# Patient Record
Sex: Female | Born: 1950 | Race: White | Hispanic: No | Marital: Married | Smoking: Never smoker
Health system: Southern US, Community
[De-identification: ages and names within clinical notes are randomized; demographics above are authoritative.]

## PROBLEM LIST (undated history)

## (undated) DIAGNOSIS — M1990 Unspecified osteoarthritis, unspecified site: Secondary | ICD-10-CM

## (undated) DIAGNOSIS — C801 Malignant (primary) neoplasm, unspecified: Secondary | ICD-10-CM

## (undated) DIAGNOSIS — T7840XA Allergy, unspecified, initial encounter: Secondary | ICD-10-CM

## (undated) DIAGNOSIS — I10 Essential (primary) hypertension: Secondary | ICD-10-CM

## (undated) DIAGNOSIS — E785 Hyperlipidemia, unspecified: Secondary | ICD-10-CM

## (undated) DIAGNOSIS — Z85828 Personal history of other malignant neoplasm of skin: Secondary | ICD-10-CM

---

## 1998-06-24 HISTORY — PX: FRACTURE SURGERY: SHX138

## 1999-11-15 ENCOUNTER — Other Ambulatory Visit: Admission: RE | Admit: 1999-11-15 | Discharge: 1999-11-15 | Payer: Self-pay | Admitting: Family Medicine

## 2000-12-04 ENCOUNTER — Other Ambulatory Visit: Admission: RE | Admit: 2000-12-04 | Discharge: 2000-12-04 | Payer: Self-pay | Admitting: Family Medicine

## 2001-03-30 ENCOUNTER — Encounter: Payer: Self-pay | Admitting: Family Medicine

## 2001-03-30 ENCOUNTER — Encounter: Admission: RE | Admit: 2001-03-30 | Discharge: 2001-03-30 | Payer: Self-pay | Admitting: Family Medicine

## 2001-12-30 ENCOUNTER — Other Ambulatory Visit: Admission: RE | Admit: 2001-12-30 | Discharge: 2001-12-30 | Payer: Self-pay | Admitting: Family Medicine

## 2002-06-24 HISTORY — PX: JOINT REPLACEMENT: SHX530

## 2015-09-28 NOTE — H&P (Signed)
TOTAL HIP ADMISSION H&P  Patient is admitted for left total hip arthroplasty, anterior approach.  Subjective:  Chief Complaint:    Left hip primary OA / pain  HPI: Michele Chavez, 65 y.o. female, has a history of pain and functional disability in the left hip(s) due to arthritis and patient has failed non-surgical conservative treatments for greater than 12 weeks to include NSAID's and/or analgesics, use of assistive devices and activity modification.  Onset of symptoms was gradual starting ~1 years ago with gradually worsening course since that time.The patient noted prior procedures of the hip to include arthroplasty on the right hip in [place], [place].  Patient currently rates pain in the left hip at 9 out of 10 with activity. Patient has night pain, worsening of pain with activity and weight bearing, trendelenberg gait, pain that interfers with activities of daily living and pain with passive range of motion. Patient has evidence of periarticular osteophytes and joint space narrowing by imaging studies. This condition presents safety issues increasing the risk of falls.   There is no current active infection.   Risks, benefits and expectations were discussed with the patient.  Risks including but not limited to the risk of anesthesia, blood clots, nerve damage, blood vessel damage, failure of the prosthesis, infection and up to and including death.  Patient understand the risks, benefits and expectations and wishes to proceed with surgery.   PCP: Lajuana Matte  D/C Plans:      Home  Post-op Meds:       No Rx given  Tranexamic Acid:      To be given - IV  Decadron:      Is to be given  FYI:     ASA  Norco     Past Medical History  Diagnosis Date  . Hypertension   . Hyperlipidemia   . Arthritis   . History of skin cancer   . Cancer [facility])     HX SKIN CANCER    Past Surgical History  Procedure Laterality Date  . Joint replacement  2004    RT TOTAL HIP  . Fracture surgery  2000    L  ANKLE     No prescriptions prior to admission   Not on File   Social History  Substance Use Topics  . Smoking status: Never Smoker   . Smokeless tobacco: Not on file  . Alcohol Use: Yes     Comment: RARE       Review of Systems  Constitutional: Negative.   HENT: Negative.   Eyes: Negative.   Respiratory: Negative.   Cardiovascular: Negative.   Gastrointestinal: Negative.   Genitourinary: Negative.   Musculoskeletal: Positive for joint pain.  Skin: Negative.   Neurological: Negative.   Endo/Heme/Allergies: Negative.   Psychiatric/Behavioral: Negative.     Objective:  Physical Exam  Constitutional: She is oriented to person, place, and time. She appears well-developed.  HENT:  Head: Normocephalic.  Eyes: Pupils are equal, round, and reactive to light.  Neck: Neck supple. No JVD present. No tracheal deviation present. No thyromegaly present.  Cardiovascular: Normal rate, regular rhythm, normal heart sounds and intact distal pulses.   Respiratory: Effort normal and breath sounds normal. No stridor. No respiratory distress. She has no wheezes.  GI: Soft. There is no tenderness. There is no guarding.  Musculoskeletal:       Left hip: She exhibits decreased range of motion, decreased strength, tenderness and bony tenderness. She exhibits no swelling, no deformity and no laceration.  Lymphadenopathy:    She has no cervical adenopathy.  Neurological: She is alert and oriented to person, place, and time.  Skin: Skin is warm and dry.  Psychiatric: She has a normal mood and affect.      Imaging Review Plain radiographs demonstrate severe degenerative joint disease of the left hip(s). The bone quality appears to be good for age and reported activity level.  Assessment/Plan:  End stage arthritis, left hip(s)  The patient history, physical examination, clinical judgement of the provider and imaging studies are consistent with end stage degenerative joint disease of the  left hip(s) and total hip arthroplasty is deemed medically necessary. The treatment options including medical management, injection therapy, arthroscopy and arthroplasty were discussed at length. The risks and benefits of total hip arthroplasty were presented and reviewed. The risks due to aseptic loosening, infection, stiffness, dislocation/subluxation,  thromboembolic complications and other imponderables were discussed.  The patient acknowledged the explanation, agreed to proceed with the plan and consent was signed. Patient is being admitted for inpatient treatment for surgery, pain control, PT, OT, prophylactic antibiotics, VTE prophylaxis, progressive ambulation and ADL's and discharge planning.The patient is planning to be discharged home.      [place] Pugh Aislinn Feliz   PA-C  10/19/2015, 8:57 AM

## 2015-10-16 NOTE — Patient Instructions (Addendum)
YOUR PROCEDURE IS SCHEDULED ON :5/2 /17  REPORT TO [facility] MAIN ENTRANCE FOLLOW SIGNS TO EAST ELEVATOR - GO TO 3rd FLOOR CHECK IN AT 3 EAST NURSES STATION (SHORT STAY) AT:  8:30 AM  CALL THIS NUMBER IF YOU HAVE PROBLEMS THE MORNING OF SURGERY [phone]  REMEMBER:ONLY 1 PER PERSON MAY GO TO SHORT STAY WITH YOU TO GET READY THE MORNING OF YOUR SURGERY  DO NOT EAT FOOD OR DRINK LIQUIDS AFTER MIDNIGHT  TAKE THESE MEDICINES THE MORNING OF SURGERY: NONE  YOU MAY NOT HAVE ANY METAL ON YOUR BODY INCLUDING HAIR PINS AND PIERCING'S. DO NOT WEAR JEWELRY, MAKEUP, LOTIONS, POWDERS OR PERFUMES. DO NOT WEAR NAIL POLISH. DO NOT SHAVE 48 HRS PRIOR TO SURGERY. MEN MAY SHAVE FACE AND NECK.  DO NOT [place]. [facility] IS NOT RESPONSIBLE FOR VALUABLES.  CONTACTS, DENTURES OR PARTIALS MAY NOT BE WORN TO SURGERY. LEAVE SUITCASE IN CAR. CAN BE BROUGHT TO ROOM AFTER SURGERY.  PATIENTS DISCHARGED THE DAY OF SURGERY WILL NOT BE ALLOWED TO DRIVE HOME.  PLEASE READ OVER THE FOLLOWING INSTRUCTION SHEETS _________________________________________________________________________________                                          [facility] - PREPARING FOR SURGERY  Before surgery, you can play an important role.  Because skin is not sterile, your skin needs to be as free of germs as possible.  You can reduce the number of germs on your skin by washing with CHG (chlorahexidine gluconate) soap before surgery.  CHG is an antiseptic cleaner which kills germs and bonds with the skin to continue killing germs even after washing. Please DO NOT use if you have an allergy to CHG or antibacterial soaps.  If your skin becomes reddened/irritated stop using the CHG and inform your nurse when you arrive at Short Stay. Do not shave (including legs and underarms) for at least 48 hours prior to the first CHG shower.  You may shave your face. Please follow these instructions  carefully:   1.  Shower with CHG Soap the night before surgery and the  morning of Surgery.   2.  If you choose to wash your hair, wash your hair first as usual with your  normal  Shampoo.   3.  After you shampoo, rinse your hair and body thoroughly to remove the  shampoo.                                         4.  Use CHG as you would any other liquid soap.  You can apply chg directly  to the skin and wash . Gently wash with scrungie or clean wascloth    5.  Apply the CHG Soap to your body ONLY FROM THE NECK DOWN.   Do not use on open                           Wound or open sores. Avoid contact with eyes, ears mouth and genitals (private parts).                        Genitals (private parts) with your normal soap.  6.  Wash thoroughly, paying special attention to the area where your surgery  will be performed.   7.  Thoroughly rinse your body with warm water from the neck down.   8.  DO NOT shower/wash with your normal soap after using and rinsing off  the CHG Soap .                9.  Pat yourself dry with a clean towel.             10.  Wear clean night clothes to bed after shower             11.  Place clean sheets on your bed the night of your first shower and do not  sleep with pets.  Day of Surgery : Do not apply any lotions/deodorants the morning of surgery.  Please wear clean clothes to the hospital/surgery center.  FAILURE TO FOLLOW THESE INSTRUCTIONS MAY RESULT IN THE CANCELLATION OF YOUR SURGERY    PATIENT SIGNATURE_________________________________  ______________________________________________________________________     Adam Phenix  An incentive spirometer is a tool that can help keep your lungs clear and active. This tool measures how well you are filling your lungs with each breath. Taking long deep breaths may help reverse or decrease the chance of developing breathing (pulmonary) problems (especially infection) following:  A long  period of time when you are unable to move or be active. BEFORE THE PROCEDURE   If the spirometer includes an indicator to show your best effort, your nurse or respiratory therapist will set it to a desired goal.  If possible, sit up straight or lean slightly forward. Try not to slouch.  Hold the incentive spirometer in an upright position. INSTRUCTIONS FOR USE   Sit on the edge of your bed if possible, or sit up as far as you can in bed or on a chair.  Hold the incentive spirometer in an upright position.  Breathe out normally.  Place the mouthpiece in your mouth and seal your lips tightly around it.  Breathe in slowly and as deeply as possible, raising the piston or the ball toward the top of the column.  Hold your breath for 3-5 seconds or for as long as possible. Allow the piston or ball to fall to the bottom of the column.  Remove the mouthpiece from your mouth and breathe out normally.  Rest for a few seconds and repeat Steps 1 through 7 at least 10 times every 1-2 hours when you are awake. Take your time and take a few normal breaths between deep breaths.  The spirometer may include an indicator to show your best effort. Use the indicator as a goal to work toward during each repetition.  After each set of 10 deep breaths, practice coughing to be sure your lungs are clear. If you have an incision (the cut made at the time of surgery), support your incision when coughing by placing a pillow or rolled up towels firmly against it. Once you are able to get out of bed, walk around indoors and cough well. You may stop using the incentive spirometer when instructed by your caregiver.  RISKS AND COMPLICATIONS  Take your time so you do not get dizzy or light-headed.  If you are in pain, you may need to take or ask for pain medication before doing incentive spirometry. It is harder to take a deep breath if you are having pain. AFTER USE  Rest and breathe slowly and easily.  It can  be helpful to keep track of a log of your progress. Your caregiver can provide you with a simple table to help with this. If you are using the spirometer at home, follow these instructions: [place] IF:   You are having difficultly using the spirometer.  You have trouble using the spirometer as often as instructed.  Your pain medication is not giving enough relief while using the spirometer.  You develop fever of 100.5 F (38.1 C) or higher. SEEK IMMEDIATE MEDICAL CARE IF:   You cough up bloody sputum that had not been present before.  You develop fever of 102 F (38.9 C) or greater.  You develop worsening pain at or near the incision site. MAKE SURE YOU:   Understand these instructions.  Will watch your condition.  Will get help right away if you are not doing well or get worse. Document Released: 10/21/2006 Document Revised: 09/02/2011 Document Reviewed: 12/22/2006 Exit[employer] Patient Information 2014 [employer], [place].   ________________________________________________________________________  WHAT IS A BLOOD TRANSFUSION? Blood Transfusion Information  A transfusion is the replacement of blood or some of its parts. Blood is made up of multiple cells which provide different functions.  Red blood cells carry oxygen and are used for blood loss replacement.  White blood cells fight against infection.  Platelets control bleeding.  Plasma helps clot blood.  Other blood products are available for specialized needs, such as hemophilia or other clotting disorders. BEFORE THE TRANSFUSION  Who gives blood for transfusions?   Healthy volunteers who are fully evaluated to make sure their blood is safe. This is blood bank blood. Transfusion therapy is the safest it has ever been in the practice of medicine. Before blood is taken from a donor, a complete history is taken to make sure that person has no history of diseases nor engages in risky social behavior (examples are  intravenous drug use or sexual activity with multiple partners). The donor's travel history is screened to minimize risk of transmitting infections, such as malaria. The donated blood is tested for signs of infectious diseases, such as HIV and hepatitis. The blood is then tested to be sure it is compatible with you in order to minimize the chance of a transfusion reaction. If you or a relative donates blood, this is often done in anticipation of surgery and is not appropriate for emergency situations. It takes many days to process the donated blood. RISKS AND COMPLICATIONS Although transfusion therapy is very safe and saves many lives, the main dangers of transfusion include:   Getting an infectious disease.  Developing a transfusion reaction. This is an allergic reaction to something in the blood you were given. Every precaution is taken to prevent this. The decision to have a blood transfusion has been considered carefully by your caregiver before blood is given. Blood is not given unless the benefits outweigh the risks. AFTER THE TRANSFUSION  Right after receiving a blood transfusion, you will usually feel much better and more energetic. This is especially true if your red blood cells have gotten low (anemic). The transfusion raises the level of the red blood cells which carry oxygen, and this usually causes an energy increase.  The nurse administering the transfusion will monitor you carefully for complications. HOME CARE INSTRUCTIONS  No special instructions are needed after a transfusion. You may find your energy is better. Speak with your caregiver about any limitations on activity for underlying diseases you may have. SEEK MEDICAL CARE IF:   Your  condition is not improving after your transfusion.  You develop redness or irritation at the intravenous (IV) site. SEEK IMMEDIATE MEDICAL CARE IF:  Any of the following symptoms occur over the next 12 hours:  Shaking chills.  You have a  temperature by mouth above 102 F (38.9 C), not controlled by medicine.  Chest, back, or muscle pain.  People around you feel you are not acting correctly or are confused.  Shortness of breath or difficulty breathing.  Dizziness and fainting.  You get a rash or develop hives.  You have a decrease in urine output.  Your urine turns a dark color or changes to pink, red, or brown. Any of the following symptoms occur over the next 10 days:  You have a temperature by mouth above 102 F (38.9 C), not controlled by medicine.  Shortness of breath.  Weakness after normal activity.  The white part of the eye turns yellow (jaundice).  You have a decrease in the amount of urine or are urinating less often.  Your urine turns a dark color or changes to pink, red, or brown. Document Released: 06/07/2000 Document Revised: 09/02/2011 Document Reviewed: 01/25/2008 [facility] Patient Information 2014 [place], [place].  _______________________________________________________________________

## 2015-10-18 ENCOUNTER — Encounter (HOSPITAL_COMMUNITY)
Admission: RE | Admit: 2015-10-18 | Discharge: 2015-10-18 | Disposition: A | Discharge status: Home / Self Care | Payer: Medicare Other | Source: Ambulatory Visit | Attending: Orthopedic Surgery | Admitting: Orthopedic Surgery

## 2015-10-18 ENCOUNTER — Other Ambulatory Visit (HOSPITAL_COMMUNITY): Payer: Self-pay

## 2015-10-18 ENCOUNTER — Other Ambulatory Visit: Payer: Self-pay

## 2015-10-18 ENCOUNTER — Encounter (HOSPITAL_COMMUNITY): Payer: Self-pay

## 2015-10-18 DIAGNOSIS — I10 Essential (primary) hypertension: Secondary | ICD-10-CM | POA: Diagnosis not present

## 2015-10-18 DIAGNOSIS — Z0183 Encounter for blood typing: Secondary | ICD-10-CM | POA: Diagnosis not present

## 2015-10-18 DIAGNOSIS — E785 Hyperlipidemia, unspecified: Secondary | ICD-10-CM | POA: Insufficient documentation

## 2015-10-18 DIAGNOSIS — Z01818 Encounter for other preprocedural examination: Secondary | ICD-10-CM | POA: Insufficient documentation

## 2015-10-18 DIAGNOSIS — M1612 Unilateral primary osteoarthritis, left hip: Secondary | ICD-10-CM | POA: Insufficient documentation

## 2015-10-18 DIAGNOSIS — Z01812 Encounter for preprocedural laboratory examination: Secondary | ICD-10-CM | POA: Insufficient documentation

## 2015-10-18 HISTORY — DX: Personal history of other malignant neoplasm of skin: Z85.828

## 2015-10-18 HISTORY — DX: Essential (primary) hypertension: I10

## 2015-10-18 HISTORY — DX: Malignant (primary) neoplasm, unspecified: C80.1

## 2015-10-18 HISTORY — DX: Hyperlipidemia, unspecified: E78.5

## 2015-10-18 HISTORY — DX: Unspecified osteoarthritis, unspecified site: M19.90

## 2015-10-18 LAB — URINALYSIS, ROUTINE W REFLEX MICROSCOPIC
Bilirubin Urine: NEGATIVE
Glucose, UA: NEGATIVE mg/dL
HGB URINE DIPSTICK: NEGATIVE
Ketones, ur: NEGATIVE mg/dL
Nitrite: NEGATIVE
PH: 6 (ref 5.0–8.0)
Protein, ur: NEGATIVE mg/dL
SPECIFIC GRAVITY, URINE: 1.015 (ref 1.005–1.030)

## 2015-10-18 LAB — CBC
HCT: 42.3 % (ref 36.0–46.0)
Hemoglobin: 14 g/dL (ref 12.0–15.0)
MCH: 30 pg (ref 26.0–34.0)
MCHC: 33.1 g/dL (ref 30.0–36.0)
MCV: 90.6 fL (ref 78.0–100.0)
PLATELETS: 213 10*3/uL (ref 150–400)
RBC: 4.67 MIL/uL (ref 3.87–5.11)
RDW: 14.3 % (ref 11.5–15.5)
WBC: 5.2 10*3/uL (ref 4.0–10.5)

## 2015-10-18 LAB — PROTIME-INR
INR: 1.07 (ref 0.00–1.49)
PROTHROMBIN TIME: 14.1 s (ref 11.6–15.2)

## 2015-10-18 LAB — BASIC METABOLIC PANEL
Anion gap: 10 (ref 5–15)
BUN: 19 mg/dL (ref 6–20)
CALCIUM: 9.4 mg/dL (ref 8.9–10.3)
CO2: 26 mmol/L (ref 22–32)
CREATININE: 0.99 mg/dL (ref 0.44–1.00)
Chloride: 102 mmol/L (ref 101–111)
GFR, EST NON AFRICAN AMERICAN: 58 mL/min — AB (ref >60)
Glucose, Bld: 144 mg/dL — ABNORMAL HIGH (ref 65–99)
Potassium: 3.7 mmol/L (ref 3.5–5.1)
SODIUM: 138 mmol/L (ref 135–145)

## 2015-10-18 LAB — SURGICAL PCR SCREEN
MRSA, PCR: NEGATIVE
STAPHYLOCOCCUS AUREUS: NEGATIVE

## 2015-10-18 LAB — URINE MICROSCOPIC-ADD ON

## 2015-10-18 LAB — APTT: APTT: 27 s (ref 24–37)

## 2015-10-18 LAB — ABO/RH: ABO/RH(D): O POS

## 2015-10-19 NOTE — Progress Notes (Signed)
Abnormal UA faxed to Dr.Olin 

## 2015-10-24 ENCOUNTER — Inpatient Hospital Stay (HOSPITAL_COMMUNITY): Payer: Medicare Other | Admitting: Anesthesiology

## 2015-10-24 ENCOUNTER — Inpatient Hospital Stay (HOSPITAL_COMMUNITY): Payer: Medicare Other

## 2015-10-24 ENCOUNTER — Encounter (HOSPITAL_COMMUNITY)
Admission: RE | Disposition: A | Discharge status: Home Health | Payer: Self-pay | Source: Ambulatory Visit | Attending: Orthopedic Surgery

## 2015-10-24 ENCOUNTER — Inpatient Hospital Stay (HOSPITAL_COMMUNITY)
Admission: RE | Admit: 2015-10-24 | Discharge: 2015-10-25 | DRG: 470 | Disposition: A | Discharge status: Home Health | Payer: Medicare Other | Source: Ambulatory Visit | Attending: Orthopedic Surgery | Admitting: Orthopedic Surgery

## 2015-10-24 ENCOUNTER — Encounter (HOSPITAL_COMMUNITY): Payer: Self-pay

## 2015-10-24 DIAGNOSIS — M1612 Unilateral primary osteoarthritis, left hip: Secondary | ICD-10-CM | POA: Diagnosis present

## 2015-10-24 DIAGNOSIS — Z96641 Presence of right artificial hip joint: Secondary | ICD-10-CM | POA: Diagnosis present

## 2015-10-24 DIAGNOSIS — Z6838 Body mass index (BMI) 38.0-38.9, adult: Secondary | ICD-10-CM

## 2015-10-24 DIAGNOSIS — I10 Essential (primary) hypertension: Secondary | ICD-10-CM | POA: Diagnosis present

## 2015-10-24 DIAGNOSIS — E669 Obesity, unspecified: Secondary | ICD-10-CM | POA: Diagnosis present

## 2015-10-24 DIAGNOSIS — Z96649 Presence of unspecified artificial hip joint: Secondary | ICD-10-CM

## 2015-10-24 DIAGNOSIS — E785 Hyperlipidemia, unspecified: Secondary | ICD-10-CM | POA: Diagnosis present

## 2015-10-24 DIAGNOSIS — M25552 Pain in left hip: Secondary | ICD-10-CM | POA: Diagnosis present

## 2015-10-24 HISTORY — DX: Allergy, unspecified, initial encounter: T78.40XA

## 2015-10-24 HISTORY — PX: TOTAL HIP ARTHROPLASTY: SHX124

## 2015-10-24 LAB — TYPE AND SCREEN
ABO/RH(D): O POS
Antibody Screen: NEGATIVE

## 2015-10-24 SURGERY — ARTHROPLASTY, HIP, TOTAL, ANTERIOR APPROACH
Anesthesia: Spinal | Site: Hip | Laterality: Left

## 2015-10-24 MED ORDER — FENTANYL CITRATE (PF) 100 MCG/2ML IJ SOLN
INTRAMUSCULAR | Status: DC | PRN
  Administered 2015-10-24 (×2): 50 ug via INTRAVENOUS

## 2015-10-24 MED ORDER — PROPOFOL 500 MG/50ML IV EMUL
INTRAVENOUS | Status: DC | PRN
  Administered 2015-10-24: 25 ug/kg/min via INTRAVENOUS

## 2015-10-24 MED ORDER — MENTHOL 3 MG MT LOZG: 1.0000 | LOZENGE | OROMUCOSAL | Status: DC | PRN

## 2015-10-24 MED ORDER — PROPOFOL 10 MG/ML IV BOLUS
INTRAVENOUS | Status: AC
  Filled 2015-10-24: qty 40

## 2015-10-24 MED ORDER — CELECOXIB 200 MG PO CAPS
200.0000 mg | ORAL_CAPSULE | Freq: Two times a day (BID) | ORAL | Status: DC
  Administered 2015-10-24 – 2015-10-25 (×2): 200 mg via ORAL
  Filled 2015-10-24 (×3): qty 1

## 2015-10-24 MED ORDER — PHENOL 1.4 % MT LIQD
1.0000 | OROMUCOSAL | Status: DC | PRN
Start: 2015-10-24 — End: 2015-10-26

## 2015-10-24 MED ORDER — SODIUM CHLORIDE 0.9 % IV SOLN
100.0000 mL/h | INTRAVENOUS | Status: DC
  Administered 2015-10-24 – 2015-10-25 (×2): 100 mL/h via INTRAVENOUS
  Filled 2015-10-24 (×4): qty 1000

## 2015-10-24 MED ORDER — PROPOFOL 10 MG/ML IV BOLUS
INTRAVENOUS | Status: AC
  Filled 2015-10-24: qty 20

## 2015-10-24 MED ORDER — HYDROMORPHONE HCL 1 MG/ML IJ SOLN
INTRAMUSCULAR | Status: AC
  Filled 2015-10-24: qty 1

## 2015-10-24 MED ORDER — ASPIRIN EC 325 MG PO TBEC
325.0000 mg | DELAYED_RELEASE_TABLET | Freq: Two times a day (BID) | ORAL | Status: DC
  Administered 2015-10-25 (×2): 325 mg via ORAL
  Filled 2015-10-24 (×3): qty 1

## 2015-10-24 MED ORDER — PROPOFOL 10 MG/ML IV BOLUS
INTRAVENOUS | Status: DC | PRN
  Administered 2015-10-24: 20 mg via INTRAVENOUS

## 2015-10-24 MED ORDER — DEXAMETHASONE SODIUM PHOSPHATE 10 MG/ML IJ SOLN
10.0000 mg | Freq: Once | INTRAMUSCULAR | Status: AC
  Administered 2015-10-24: 10 mg via INTRAVENOUS

## 2015-10-24 MED ORDER — MEPERIDINE HCL 50 MG/ML IJ SOLN: 6.2500 mg | INTRAMUSCULAR | Status: DC | PRN

## 2015-10-24 MED ORDER — DIPHENHYDRAMINE HCL 25 MG PO CAPS: 25.0000 mg | ORAL_CAPSULE | Freq: Four times a day (QID) | ORAL | Status: DC | PRN

## 2015-10-24 MED ORDER — CEFAZOLIN SODIUM-DEXTROSE 2-4 GM/100ML-% IV SOLN
2.0000 g | INTRAVENOUS | Status: AC
  Administered 2015-10-24: 2 g via INTRAVENOUS

## 2015-10-24 MED ORDER — EPHEDRINE SULFATE 50 MG/ML IJ SOLN
INTRAMUSCULAR | Status: DC | PRN
  Administered 2015-10-24 (×2): 10 mg via INTRAVENOUS

## 2015-10-24 MED ORDER — ONDANSETRON HCL 4 MG PO TABS: 4.0000 mg | ORAL_TABLET | Freq: Four times a day (QID) | ORAL | Status: DC | PRN

## 2015-10-24 MED ORDER — LACTATED RINGERS IV SOLN
INTRAVENOUS | Status: DC
  Administered 2015-10-24 (×2): via INTRAVENOUS

## 2015-10-24 MED ORDER — TRANEXAMIC ACID 1000 MG/10ML IV SOLN
1000.0000 mg | Freq: Once | INTRAVENOUS | Status: AC
  Administered 2015-10-24: 1000 mg via INTRAVENOUS
  Filled 2015-10-24: qty 10

## 2015-10-24 MED ORDER — METHOCARBAMOL 1000 MG/10ML IJ SOLN
500.0000 mg | Freq: Four times a day (QID) | INTRAVENOUS | Status: DC | PRN
  Administered 2015-10-24: 500 mg via INTRAVENOUS
  Filled 2015-10-24: qty 5
  Filled 2015-10-24: qty 550

## 2015-10-24 MED ORDER — FENTANYL CITRATE (PF) 100 MCG/2ML IJ SOLN
INTRAMUSCULAR | Status: AC
  Filled 2015-10-24: qty 2

## 2015-10-24 MED ORDER — CHLORHEXIDINE GLUCONATE 4 % EX LIQD: 60.0000 mL | Freq: Once | CUTANEOUS | Status: DC

## 2015-10-24 MED ORDER — OLMESARTAN MEDOXOMIL-HCTZ 40-25 MG PO TABS: 1.0000 | ORAL_TABLET | Freq: Every day | ORAL | Status: DC

## 2015-10-24 MED ORDER — MIDAZOLAM HCL 5 MG/5ML IJ SOLN
INTRAMUSCULAR | Status: DC | PRN
  Administered 2015-10-24 (×2): 1 mg via INTRAVENOUS

## 2015-10-24 MED ORDER — PRAVASTATIN SODIUM 20 MG PO TABS
20.0000 mg | ORAL_TABLET | Freq: Every day | ORAL | Status: DC
  Administered 2015-10-24: 20 mg via ORAL
  Filled 2015-10-24 (×2): qty 1

## 2015-10-24 MED ORDER — ONDANSETRON HCL 4 MG/2ML IJ SOLN: 4.0000 mg | Freq: Four times a day (QID) | INTRAMUSCULAR | Status: DC | PRN

## 2015-10-24 MED ORDER — HYDROMORPHONE HCL 1 MG/ML IJ SOLN
0.2500 mg | INTRAMUSCULAR | Status: DC | PRN
  Administered 2015-10-24 (×4): 0.5 mg via INTRAVENOUS

## 2015-10-24 MED ORDER — BUPIVACAINE IN DEXTROSE 0.75-8.25 % IT SOLN
INTRATHECAL | Status: DC | PRN
  Administered 2015-10-24: 2 mL via INTRATHECAL

## 2015-10-24 MED ORDER — IRBESARTAN 300 MG PO TABS
300.0000 mg | ORAL_TABLET | Freq: Every day | ORAL | Status: DC
  Filled 2015-10-24: qty 1

## 2015-10-24 MED ORDER — SODIUM CHLORIDE 0.9 % IR SOLN
Status: DC | PRN
  Administered 2015-10-24: 1000 mL

## 2015-10-24 MED ORDER — DOCUSATE SODIUM 100 MG PO CAPS
100.0000 mg | ORAL_CAPSULE | Freq: Two times a day (BID) | ORAL | Status: DC
Start: 2015-10-24 — End: 2015-10-26
  Administered 2015-10-24 – 2015-10-25 (×2): 100 mg via ORAL

## 2015-10-24 MED ORDER — HYDROMORPHONE HCL 1 MG/ML IJ SOLN: 0.5000 mg | INTRAMUSCULAR | Status: DC | PRN

## 2015-10-24 MED ORDER — LACTATED RINGERS IV SOLN
INTRAVENOUS | Status: DC
  Administered 2015-10-24: 14:00:00 via INTRAVENOUS

## 2015-10-24 MED ORDER — CEFAZOLIN SODIUM-DEXTROSE 2-4 GM/100ML-% IV SOLN
INTRAVENOUS | Status: AC
  Filled 2015-10-24: qty 100

## 2015-10-24 MED ORDER — BISACODYL 10 MG RE SUPP: 10.0000 mg | Freq: Every day | RECTAL | Status: DC | PRN

## 2015-10-24 MED ORDER — POLYETHYLENE GLYCOL 3350 17 G PO PACK: 17.0000 g | PACK | Freq: Two times a day (BID) | ORAL | Status: DC

## 2015-10-24 MED ORDER — SODIUM CHLORIDE 0.9 % IV SOLN
10.0000 mg | INTRAVENOUS | Status: DC | PRN
  Administered 2015-10-24: 50 ug/min via INTRAVENOUS

## 2015-10-24 MED ORDER — METHOCARBAMOL 500 MG PO TABS
500.0000 mg | ORAL_TABLET | Freq: Four times a day (QID) | ORAL | Status: DC | PRN
  Administered 2015-10-24 – 2015-10-25 (×3): 500 mg via ORAL
  Filled 2015-10-24 (×3): qty 1

## 2015-10-24 MED ORDER — MIDAZOLAM HCL 2 MG/2ML IJ SOLN
INTRAMUSCULAR | Status: AC
  Filled 2015-10-24: qty 2

## 2015-10-24 MED ORDER — ALUM & MAG HYDROXIDE-SIMETH 200-200-20 MG/5ML PO SUSP: 30.0000 mL | ORAL | Status: DC | PRN

## 2015-10-24 MED ORDER — HYDROCODONE-ACETAMINOPHEN 7.5-325 MG PO TABS
1.0000 | ORAL_TABLET | ORAL | Status: DC
  Administered 2015-10-24 – 2015-10-25 (×6): 2 via ORAL
  Filled 2015-10-24 (×8): qty 2

## 2015-10-24 MED ORDER — FERROUS SULFATE 325 (65 FE) MG PO TABS
325.0000 mg | ORAL_TABLET | Freq: Three times a day (TID) | ORAL | Status: DC
  Administered 2015-10-25: 325 mg via ORAL
  Filled 2015-10-24 (×5): qty 1

## 2015-10-24 MED ORDER — PHENYLEPHRINE HCL 10 MG/ML IJ SOLN
INTRAMUSCULAR | Status: AC
  Filled 2015-10-24: qty 1

## 2015-10-24 MED ORDER — ONDANSETRON HCL 4 MG/2ML IJ SOLN
INTRAMUSCULAR | Status: AC
  Filled 2015-10-24: qty 2

## 2015-10-24 MED ORDER — METOCLOPRAMIDE HCL 10 MG PO TABS: 5.0000 mg | ORAL_TABLET | Freq: Three times a day (TID) | ORAL | Status: DC | PRN

## 2015-10-24 MED ORDER — HYDROCHLOROTHIAZIDE 25 MG PO TABS
25.0000 mg | ORAL_TABLET | Freq: Every day | ORAL | Status: DC
  Filled 2015-10-24: qty 1

## 2015-10-24 MED ORDER — CEFAZOLIN SODIUM-DEXTROSE 2-4 GM/100ML-% IV SOLN
2.0000 g | Freq: Four times a day (QID) | INTRAVENOUS | Status: AC
  Administered 2015-10-24 – 2015-10-25 (×2): 2 g via INTRAVENOUS
  Filled 2015-10-24 (×2): qty 100

## 2015-10-24 MED ORDER — METOCLOPRAMIDE HCL 5 MG/ML IJ SOLN
5.0000 mg | Freq: Three times a day (TID) | INTRAMUSCULAR | Status: DC | PRN
Start: 2015-10-24 — End: 2015-10-26

## 2015-10-24 MED ORDER — DEXAMETHASONE SODIUM PHOSPHATE 10 MG/ML IJ SOLN
INTRAMUSCULAR | Status: AC
  Filled 2015-10-24: qty 1

## 2015-10-24 MED ORDER — DEXAMETHASONE SODIUM PHOSPHATE 10 MG/ML IJ SOLN
10.0000 mg | Freq: Once | INTRAMUSCULAR | Status: AC
  Administered 2015-10-25: 10 mg via INTRAVENOUS
  Filled 2015-10-24: qty 1

## 2015-10-24 MED ORDER — MAGNESIUM CITRATE PO SOLN: 1.0000 | Freq: Once | ORAL | Status: DC | PRN

## 2015-10-24 MED ORDER — STERILE WATER FOR IRRIGATION IR SOLN
Status: DC | PRN
  Administered 2015-10-24: 2000 mL

## 2015-10-24 SURGICAL SUPPLY — 34 items
CAPT HIP TOTAL 2 ×1 IMPLANT
CLOTH BEACON ORANGE TIMEOUT ST (SAFETY) ×2 IMPLANT
COVER PERINEAL POST (MISCELLANEOUS) ×2 IMPLANT
DRAPE STERI IOBAN 125X83 (DRAPES) ×2 IMPLANT
DRAPE U-SHAPE 47X51 STRL (DRAPES) ×4 IMPLANT
DRESSING AQUACEL AG SP 3.5X10 (GAUZE/BANDAGES/DRESSINGS) ×1 IMPLANT
DRSG AQUACEL AG ADV 3.5X10 (GAUZE/BANDAGES/DRESSINGS) ×1 IMPLANT
DRSG AQUACEL AG SP 3.5X10 (GAUZE/BANDAGES/DRESSINGS) ×2
DURAPREP 26ML APPLICATOR (WOUND CARE) ×2 IMPLANT
ELECT PENCIL ROCKER SW 15FT (MISCELLANEOUS) ×1 IMPLANT
ELECT REM PT RETURN 9FT ADLT (ELECTROSURGICAL) ×2
ELECTRODE REM PT RTRN 9FT ADLT (ELECTROSURGICAL) ×1 IMPLANT
GLOVE BIOGEL M STRL SZ7.5 (GLOVE) ×2 IMPLANT
GLOVE BIOGEL PI IND STRL 7.0 (GLOVE) IMPLANT
GLOVE BIOGEL PI IND STRL 7.5 (GLOVE) ×1 IMPLANT
GLOVE BIOGEL PI INDICATOR 7.0 (GLOVE) ×1
GLOVE BIOGEL PI INDICATOR 7.5 (GLOVE) ×5
GLOVE ORTHO TXT STRL SZ7.5 (GLOVE) ×3 IMPLANT
GLOVE SURG SS PI 7.0 STRL IVOR (GLOVE) ×1 IMPLANT
GLOVE SURG SS PI 7.5 STRL IVOR (GLOVE) ×1 IMPLANT
GOWN SPEC L3 XXLG W/TWL (GOWN DISPOSABLE) ×1 IMPLANT
GOWN STRL REUS W/TWL LRG LVL3 (GOWN DISPOSABLE) ×2 IMPLANT
GOWN STRL REUS W/TWL XL LVL3 (GOWN DISPOSABLE) ×3 IMPLANT
HOLDER FOLEY CATH W/STRAP (MISCELLANEOUS) ×2 IMPLANT
LIQUID BAND (GAUZE/BANDAGES/DRESSINGS) ×2 IMPLANT
PACK ANTERIOR HIP CUSTOM (KITS) ×2 IMPLANT
SAW OSC TIP CART 19.5X105X1.3 (SAW) ×2 IMPLANT
SUT MNCRL AB 4-0 PS2 18 (SUTURE) ×2 IMPLANT
SUT VIC AB 1 CT1 36 (SUTURE) ×6 IMPLANT
SUT VIC AB 2-0 CT1 27 (SUTURE) ×6
SUT VIC AB 2-0 CT1 TAPERPNT 27 (SUTURE) ×2 IMPLANT
SUT VLOC 180 0 24IN GS25 (SUTURE) ×2 IMPLANT
TRAY FOLEY W/METER SILVER 14FR (SET/KITS/TRAYS/PACK) ×1 IMPLANT
YANKAUER SUCT BULB TIP 10FT TU (MISCELLANEOUS) ×1 IMPLANT

## 2015-10-24 NOTE — Anesthesia Postprocedure Evaluation (Signed)
Anesthesia Post Note  Patient: Michele Chavez  Procedure(s) Performed: Procedure(s) (LRB): LEFT TOTAL HIP ARTHROPLASTY ANTERIOR APPROACH (Left)  Patient location during evaluation: PACU Anesthesia Type: Spinal Level of consciousness: oriented and awake and alert Pain management: pain level controlled Vital Signs Assessment: post-procedure vital signs reviewed and stable Respiratory status: spontaneous breathing, respiratory function stable and patient connected to nasal cannula oxygen Cardiovascular status: blood pressure returned to baseline and stable Postop Assessment: no headache, no backache and spinal receding Anesthetic complications: no    Last Vitals:  Filed Vitals:   10/24/15 1439 10/24/15 1445  BP: 114/62 107/83  Pulse: 59 62  Temp:  36.4 C  Resp: 17 18    Last Pain:  Filed Vitals:   10/24/15 1447  PainSc: [place] [name]

## 2015-10-24 NOTE — Progress Notes (Signed)
Utilization review completed.  

## 2015-10-24 NOTE — Transfer of Care (Signed)
Immediate Anesthesia Transfer of Care Note  Patient: Michele Chavez  Procedure(s) Performed: Procedure(s): LEFT TOTAL HIP ARTHROPLASTY ANTERIOR APPROACH (Left)  Patient Location: PACU  Anesthesia Type:Spinal/MAC  Level of Consciousness:  sedated, patient cooperative and responds to stimulation  Airway & Oxygen Therapy:Patient Spontanous Breathing and Patient connected to face mask oxgen  Post-op Assessment:  Report given to PACU RN and Post -op Vital signs reviewed and stable  Post vital signs:  Reviewed and stable  Last Vitals:  Filed Vitals:   10/24/15 0836  BP: 144/67  Pulse: 74  Temp: 36.4 C  Resp: 16    Complications: No apparent anesthesia complications

## 2015-10-24 NOTE — Anesthesia Preprocedure Evaluation (Addendum)
Anesthesia Evaluation  Patient identified by MRN, date of birth, ID band Patient awake    Reviewed: Allergy & Precautions, NPO status , Patient's Chart, lab work & pertinent test results  Airway Mallampati: III   Neck ROM: Full  Mouth opening: Limited Mouth Opening  Dental  (+) Teeth Intact   Pulmonary neg pulmonary ROS,    breath sounds clear to auscultation       Cardiovascular hypertension, Pt. on medications  Rhythm:Regular Rate:Normal     Neuro/Psych negative neurological ROS  negative psych ROS   GI/Hepatic negative GI ROS, Neg liver ROS,   Endo/Other  negative endocrine ROS  Renal/GU negative Renal ROS  negative genitourinary   Musculoskeletal  (+) Arthritis ,   Abdominal (+) + obese,   Peds negative pediatric ROS (+)  Hematology negative hematology ROS (+)   Anesthesia Other Findings - HLD  Reproductive/Obstetrics negative OB ROS                            Lab Results  Component Value Date   WBC 5.2 10/18/2015   HGB 14.0 10/18/2015   HCT 42.3 10/18/2015   MCV 90.6 10/18/2015   PLT 213 10/18/2015   Lab Results  Component Value Date   INR 1.07 10/18/2015     Anesthesia Physical Anesthesia Plan  ASA: III  Anesthesia Plan: Spinal   Post-op Pain Management:    Induction: Intravenous  Airway Management Planned: Simple Face Mask  Additional Equipment:   Intra-op Plan:   Post-operative Plan: Extubation in OR  Informed Consent: I have reviewed the patients History and Physical, chart, labs and discussed the procedure including the risks, benefits and alternatives for the proposed anesthesia with the patient or authorized representative who has indicated his/her understanding and acceptance.     Plan Discussed with: CRNA  Anesthesia Plan Comments:         Anesthesia Quick Evaluation

## 2015-10-24 NOTE — Anesthesia Procedure Notes (Signed)
Spinal Patient location during procedure: OR Start time: 10/24/2015 11:11 AM End time: 10/24/2015 11:16 AM Staffing Resident/CRNA: Alfonso Patten J Performed by: resident/CRNA  Preanesthetic Checklist Completed: patient identified, site marked, surgical consent, pre-op evaluation, timeout performed, IV checked, risks and benefits discussed and monitors and equipment checked Spinal Block Patient position: sitting Prep: Betadine and site prepped and draped Patient monitoring: heart rate, continuous pulse ox and blood pressure Approach: midline Location: L3-4 Injection technique: single-shot Needle Needle type: Sprotte  Needle gauge: 24 G Needle length: 10 cm Additional Notes Expiration date on kit confirmed.    Single attempt - good CSF return, no blood or parethesia noted.  Pt. Tolerated procedure well.

## 2015-10-24 NOTE — Interval H&P Note (Signed)
History and Physical Interval Note:  10/24/2015 10:11 AM  Michele Chavez  has presented today for surgery, with the diagnosis of LEFT HIP OA  The various methods of treatment have been discussed with the patient and family. After consideration of risks, benefits and other options for treatment, the patient has consented to  Procedure(s): LEFT TOTAL HIP ARTHROPLASTY ANTERIOR APPROACH (Left) as a surgical intervention .  The patient's history has been reviewed, patient examined, no change in status, stable for surgery.  I have reviewed the patient's chart and labs.  Questions were answered to the patient's satisfaction.     Mauri Pole

## 2015-10-24 NOTE — Progress Notes (Signed)
Portable AP Pelvis  And Lateral Left Hip X-rays done 

## 2015-10-24 NOTE — Op Note (Signed)
NAME:  Michele Chavez NO.: [id number]      MEDICAL RECORD NO.: [id number]      FACILITY:  [facility]      PHYSICIAN:  Paralee Cancel D  DATE OF BIRTH:  Apr 03, 1951     DATE OF PROCEDURE:  10/24/2015                                 OPERATIVE REPORT         PREOPERATIVE DIAGNOSIS: Left  hip osteoarthritis.      POSTOPERATIVE DIAGNOSIS:  Left hip osteoarthritis. History of right total hip replacement     PROCEDURE:  Left total hip replacement through an anterior approach   utilizing DePuy THR system, component size 67mm pinnacle cup, a size 32+4 neutral   Altrex liner, a size 2 Hi Tri Lock stem with a 32+5 delta ceramic   ball.      SURGEON:  Pietro Cassis. Alvan Dame, M.D.      ASSISTANT:  Nehemiah Massed, PA-C     ANESTHESIA:  Spinal.      SPECIMENS:  None.      COMPLICATIONS:  None.      BLOOD LOSS:  400 cc     DRAINS:  None      INDICATION OF THE PROCEDURE:  Michele Chavez is a 65 y.o. female who had   presented to office for evaluation of left hip pain.  Radiographs revealed   progressive degenerative changes with bone-on-bone   articulation to the  hip joint.  The patient had painful limited range of   motion significantly affecting their overall quality of life.  The patient was failing to    respond to conservative measures, and at this point was ready   to proceed with more definitive measures.  The patient has noted progressive   degenerative changes in his hip, progressive problems and dysfunction   with regarding the hip prior to surgery.  Consent was obtained for   benefit of pain relief.  Specific risk of infection, DVT, component   failure, dislocation, need for revision surgery, as well discussion of   the anterior versus posterior approach were reviewed.  Consent was   obtained for benefit of anterior pain relief through an anterior   approach.      PROCEDURE IN DETAIL:  The patient was brought to operative theater.   Once  adequate anesthesia, preoperative antibiotics, 2gm of Ancef, 1 gm of Tranexamic Acid, and 10 mg of Decadron administered.   The patient was positioned supine on the OSI Hanna table.  Once adequate   padding of boney process was carried out, we had predraped out the hip, and  used fluoroscopy to confirm orientation of the pelvis and position.      The left hip was then prepped and draped from proximal iliac crest to   mid thigh with shower curtain technique.      Time-out was performed identifying the patient, planned procedure, and   extremity.     An incision was then made 2 cm distal and lateral to the   anterior superior iliac spine extending over the orientation of the   tensor fascia lata muscle and sharp dissection was carried down to the   fascia of the muscle and protractor placed in the soft tissues.  The fascia was then incised.  The muscle belly was identified and swept   laterally and retractor placed along the superior neck.  Following   cauterization of the circumflex vessels and removing some pericapsular   fat, a second cobra retractor was placed on the inferior neck.  A third   retractor was placed on the anterior acetabulum after elevating the   anterior rectus.  A L-capsulotomy was along the line of the   superior neck to the trochanteric fossa, then extended proximally and   distally.  Tag sutures were placed and the retractors were then placed   intracapsular.  We then identified the trochanteric fossa and   orientation of my neck cut, confirmed this radiographically   and then made a neck osteotomy with the femur on traction.  The femoral   head was removed without difficulty or complication.  Traction was let   off and retractors were placed posterior and anterior around the   acetabulum.      The labrum and foveal tissue were debrided.  I began reaming with a 55mm   reamer and reamed up to 74mm reamer with good bony bed preparation and a 7mm   cup was  chosen.  The final 6mm Pinnacle cup was then impacted under fluoroscopy  to confirm the depth of penetration and orientation with respect to   abduction.  A screw was placed followed by the hole eliminator.  The final   32+4 neutral Altrex liner was impacted with good visualized rim fit.  The cup was positioned anatomically within the acetabular portion of the pelvis.      At this point, the femur was rolled at 80 degrees.  Further capsule was   released off the inferior aspect of the femoral neck.  I then   released the superior capsule proximally.  The hook was placed laterally   along the femur and elevated manually and held in position with the bed   hook.  The leg was then extended and adducted with the leg rolled to 100   degrees of external rotation.  Once the proximal femur was fully   exposed, I used a box osteotome to set orientation.  I then began   broaching with the starting chili pepper broach and passed this by hand and then broached up to 2.  With the 2 broach in place I chose a high offset neck and did several trial reductions.  The offset was appropriate, leg lengths   appeared to be equal best matched with a +5 head ball confirmed radiographically.   Given these findings, I went ahead and dislocated the hip, repositioned all   retractors and positioned the right hip in the extended and abducted position.  The final 2Hi Tri Lock stem was   chosen and it was impacted down to the level of neck cut.  Based on this   and the trial reduction, a 32+5 delta ceramic ball was chosen and   impacted onto a clean and dry trunnion, and the hip was reduced.  The   hip had been irrigated throughout the case again at this point.  I did   reapproximate the superior capsular leaflet to the anterior leaflet   using #1 Vicryl.  The fascia of the   tensor fascia lata muscle was then reapproximated using #1 Vicryl and #0 V-lock sutures.  The   remaining wound was closed with 2-0 Vicryl and  running 4-0 Monocryl.   The hip was cleaned,  dried, and dressed sterilely using Dermabond and   Aquacel dressing.  She was then brought   to recovery room in stable condition tolerating the procedure well.    Nehemiah Massed, PA-C was present for the entirety of the case involved from   preoperative positioning, perioperative retractor management, general   facilitation of the case, as well as primary wound closure as assistant.            Pietro Cassis Alvan Dame, M.D.        10/24/2015 12:37 PM

## 2015-10-24 NOTE — Progress Notes (Signed)
X-ray results noted 

## 2015-10-25 DIAGNOSIS — E669 Obesity, unspecified: Secondary | ICD-10-CM | POA: Diagnosis present

## 2015-10-25 LAB — CBC
HCT: 33.1 % — ABNORMAL LOW (ref 36.0–46.0)
Hemoglobin: 11.1 g/dL — ABNORMAL LOW (ref 12.0–15.0)
MCH: 30.2 pg (ref 26.0–34.0)
MCHC: 33.5 g/dL (ref 30.0–36.0)
MCV: 89.9 fL (ref 78.0–100.0)
PLATELETS: 195 10*3/uL (ref 150–400)
RBC: 3.68 MIL/uL — AB (ref 3.87–5.11)
RDW: 14.1 % (ref 11.5–15.5)
WBC: 7.7 10*3/uL (ref 4.0–10.5)

## 2015-10-25 LAB — BASIC METABOLIC PANEL
ANION GAP: 8 (ref 5–15)
BUN: 12 mg/dL (ref 6–20)
CALCIUM: 8.3 mg/dL — AB (ref 8.9–10.3)
CO2: 24 mmol/L (ref 22–32)
Chloride: 106 mmol/L (ref 101–111)
Creatinine, Ser: 0.8 mg/dL (ref 0.44–1.00)
GFR calc Af Amer: >60 mL/min (ref >60)
Glucose, Bld: 151 mg/dL — ABNORMAL HIGH (ref 65–99)
POTASSIUM: 4 mmol/L (ref 3.5–5.1)
SODIUM: 138 mmol/L (ref 135–145)

## 2015-10-25 MED ORDER — HYDROCODONE-ACETAMINOPHEN 7.5-325 MG PO TABS: 1.0000 | ORAL_TABLET | ORAL | Status: DC | PRN

## 2015-10-25 MED ORDER — FERROUS SULFATE 325 (65 FE) MG PO TABS: 325.0000 mg | ORAL_TABLET | Freq: Three times a day (TID) | ORAL | Status: DC

## 2015-10-25 MED ORDER — POLYETHYLENE GLYCOL 3350 17 G PO PACK: 17.0000 g | PACK | Freq: Two times a day (BID) | ORAL | Status: DC

## 2015-10-25 MED ORDER — DOCUSATE SODIUM 100 MG PO CAPS: 100.0000 mg | ORAL_CAPSULE | Freq: Two times a day (BID) | ORAL | Status: DC

## 2015-10-25 MED ORDER — ASPIRIN 325 MG PO TBEC: 325.0000 mg | DELAYED_RELEASE_TABLET | Freq: Two times a day (BID) | ORAL | Status: AC

## 2015-10-25 MED ORDER — TIZANIDINE HCL 4 MG PO TABS: 4.0000 mg | ORAL_TABLET | Freq: Four times a day (QID) | ORAL | Status: DC | PRN

## 2015-10-25 NOTE — Progress Notes (Signed)
OT Note:  Pt plans to use commode just prior to leaving.  A 3:1 commode was delivered to room.  Pt also has a shower stall, so reviewed use of 3:1 as shower seat if desired (and recommendation that husband wipes legs off after use.  No further OT is needed at this time.  [place], OTR/L [id number] 10/25/2015

## 2015-10-25 NOTE — Progress Notes (Signed)
[facility]    [facility] is providing the following services: RW and Commode  If patient discharges after hours, please call [phone].   [name] 10/25/2015, 11:26 AM

## 2015-10-25 NOTE — Care Management Note (Signed)
Case Management Note  Patient Details  Name: Michele Chavez MRN: [id number] Date of Birth: [birth year]  Subjective/Objective:                  LEFT TOTAL HIP ARTHROPLASTY ANTERIOR APPROACH (Left)  Action/Plan: Discharge planning Expected Discharge Date:  10/25/15               Expected Discharge Plan:  Home/Self Care  In-House Referral:     Discharge planning Services  CM Consult  Post Acute Care Choice:    Choice offered to:  Patient  DME Arranged:  [place] Rile rolling DME Agency:  [place]:  NA [place] Agency:  NA  Status of Service:  Completed, signed off  Medicare Important Message Given:    Date Medicare IM Given:    Medicare IM give by:    Date Additional Medicare IM Given:    Additional Medicare Important Message give by:     If discussed at [place] of Stay Meetings, dates discussed:    Additional Comments: CM confirmed with pt she is to have NO HH services.  CM called [facility] DME rep, Merry Proud to please deliver the rolling walker to room so pt can discharge.  No other CM needs were communicated. Dellie Catholic, RN 10/25/2015, 11:20 AM

## 2015-10-25 NOTE — Evaluation (Signed)
Occupational Therapy Evaluation Patient Details Name: Michele Chavez MRN: [id number] DOB: 1950-09-09 Today's Date: 10/25/2015    History of Present Illness s/p L DA THA   Clinical Impression   This 65 year old female was admitted for the above surgery. She will benefit from continued OT in acute setting to further assess need for toilet DME.  Husband will assist her at home.    Follow Up Recommendations  No OT follow up;Supervision/Assistance - 24 hour    Equipment Recommendations   (to be further assessed: has high toilets)    Recommendations for Other Services       Precautions / Restrictions Precautions Precautions: Fall Restrictions Weight Bearing Restrictions: No      Mobility Bed Mobility Overal bed mobility: Needs Assistance Bed Mobility: Supine to Sit     Supine to sit: Min assist     General bed mobility comments: HOB raised; assisted for LLE  Transfers Overall transfer level: Needs assistance Equipment used: Rolling walker (2 wheeled) Transfers: Sit to/from [facility] Sit to Stand: Min assist Stand pivot transfers: Min assist       General transfer comment: assist to stand and steady; cues for UE/LE placement    Balance                                            ADL Overall ADL's : Needs assistance/impaired             Lower Body Bathing: Moderate assistance;Sit to/from stand       Lower Body Dressing: Maximal assistance;Sit to/from stand   Toilet Transfer: Minimal assistance;Stand-pivot;RW (to chair)             General ADL Comments: this was pt's first time getting up   She sat EOB for awhile to let head adjust and stood for a couple of minutes.  Performed SPT slowly with cues for sequence.  Pt has a reacher,and husband will assist as needed for adls.  She can perform UB adls with set up.     Vision     Perception     Praxis      Pertinent Vitals/Pain Pain Assessment: Faces Faces Pain  Scale: Hurts whole lot (when sitting back down, decreased after a couple of minutes) Pain Location: L hip Pain Descriptors / Indicators: Aching Pain Intervention(s): Limited activity within patient's tolerance;Monitored during session;Premedicated before session;Repositioned;Ice applied     Hand Dominance     Extremity/Trunk Assessment Upper Extremity Assessment Upper Extremity Assessment: Overall WFL for tasks assessed (has some shoulder issues at times)           Communication Communication Communication: No difficulties   Cognition Arousal/Alertness: Awake/alert Behavior During Therapy: WFL for tasks assessed/performed Overall Cognitive Status: Within Functional Limits for tasks assessed                     General Comments       Exercises       Shoulder Instructions      Home Living Family/patient expects to be discharged to:: Private residence Living Arrangements: Spouse/significant other   Type of Home: House             Bathroom Shower/Tub: Teacher, early years/pre: Standard     Home Equipment: Bedside commode   Additional Comments: lives 2 1/2 to 3 hours away  Prior Functioning/Environment Level of Independence: Independent        Comments: has a Secondary school teacher and husband will assist as needed    OT Diagnosis: Acute pain   OT Problem List: Decreased activity tolerance;Decreased knowledge of use of DME or AE;Pain   OT Treatment/Interventions: Self-care/ADL training;Patient/family education;DME and/or AE instruction    OT Goals(Current goals can be found in the care plan section) Acute Rehab OT Goals Patient Stated Goal: home today; return to independence; decreased pain OT Goal Formulation: With patient Time For Goal Achievement: 11/01/15 Potential to Achieve Goals: Good ADL Goals Pt Will Transfer to Toilet: with min guard assist;bedside commode;ambulating (vs high toilet) Pt Will Perform Toileting - Clothing Manipulation  and hygiene: with min guard assist;sit to/from stand  OT Frequency: Min 2X/week   Barriers to D/C:            Co-evaluation              End of Session    Activity Tolerance:  limited by pain;fatique Patient left: in chair;with call bell/phone within reach;with chair alarm set;with family/visitor present   Time: [id number] OT Time Calculation (min): 33 min Charges:  OT General Charges $OT Visit: 1 Procedure OT Evaluation $OT Eval Low Complexity: 1 Procedure OT Treatments $Therapeutic Activity: 8-22 mins G-Codes:    Delton Stelle 2015-11-06, 11:32 AM Lesle Chris, OTR/L [phone] 11-06-2015

## 2015-10-25 NOTE — Evaluation (Signed)
Physical Therapy Evaluation Patient Details Name: Michele Chavez MRN: [id number] DOB: 1951/04/05 Today's Date: 10/25/2015   History of Present Illness  65 yo female s/p L THA-DA 10/24/15. Hx of R THA-posterior  Clinical Impression  On eval, pt was Min guard assist for mobility-walked ~125 feet with RW. Pain rated 3/10 during session. Practiced exercises, gait training, stair negotiation. Issued HEP for pt to perform 2x/day. All education completed. Ready to d/c from PT standpoint-made RN aware.     Follow Up Recommendations No PT follow up    Equipment Recommendations  Rolling walker with 5" wheels    Recommendations for Other Services       Precautions / Restrictions Precautions Precautions: Fall Restrictions Weight Bearing Restrictions: No      Mobility  Bed Mobility Overal bed mobility: Needs Assistance Bed Mobility: Supine to Sit     Supine to sit: Min assist     General bed mobility comments: OOb in recliner  Transfers Overall transfer level: Needs assistance Equipment used: Rolling walker (2 wheeled) Transfers: Sit to/from Stand Sit to Stand: Min guard Stand pivot transfers: Min assist       General transfer comment: VCs safety, hand/LE placement. close guard for safety.   Ambulation/Gait Ambulation/Gait assistance: Min guard Ambulation Distance (Feet): 125 Feet Assistive device: Rolling walker (2 wheeled) Gait Pattern/deviations: Step-through pattern;Decreased stride length     General Gait Details: close guard for safety. Progressed to reciprocal gait pattern.   Stairs Stairs: Yes Stairs assistance: Min assist Stair Management: Step to pattern;Forwards Number of Stairs: 2 General stair comments: Assist to stabilize. husband present to observe. VCs safety, sequence.   Wheelchair Mobility    Modified Rankin (Stroke Patients Only)       Balance                                             Pertinent Vitals/Pain Pain  Assessment: 0-10 Pain Score: 3  Faces Pain Scale: Hurts whole lot (when sitting back down, decreased after a couple of minutes) Pain Location: L hip with activity Pain Descriptors / Indicators: Sore Pain Intervention(s): Monitored during session;Repositioned    Home Living Family/patient expects to be discharged to:: Private residence Living Arrangements: Spouse/significant other   Type of Home: House         Home Equipment: Bedside commode Additional Comments: lives 2 1/2 to 3 hours away    Prior Function Level of Independence: Independent         Comments: has a Secondary school teacher and husband will assist as needed     Hand Dominance        Extremity/Trunk Assessment   Upper Extremity Assessment: Defer to OT evaluation           Lower Extremity Assessment: LLE deficits/detail   LLE Deficits / Details: hip ~3/5. Knee, ankle WFL  Cervical / Trunk Assessment: Normal  Communication   Communication: No difficulties  Cognition Arousal/Alertness: Awake/alert Behavior During Therapy: WFL for tasks assessed/performed Overall Cognitive Status: Within Functional Limits for tasks assessed                      General Comments      Exercises Total Joint Exercises Ankle Circles/Pumps: AROM;Both;10 reps;Supine Quad Sets: AROM;Both;10 reps;Supine Heel Slides: AAROM;Left;10 reps;Supine Hip ABduction/ADduction: AAROM;Left;10 reps;Supine      Assessment/Plan    PT Assessment Patent does not  need any further PT services  PT Diagnosis Difficulty walking;Acute pain   PT Problem List    PT Treatment Interventions     PT Goals (Current goals can be found in the Care Plan section) Acute Rehab PT Goals Patient Stated Goal: home today; return to independence; decreased pain PT Goal Formulation: All assessment and education complete, DC therapy    Frequency     Barriers to discharge        Co-evaluation               End of Session Equipment Utilized  During Treatment: Gait belt Activity Tolerance: Patient tolerated treatment well Patient left: in chair;with call bell/phone within reach;with family/visitor present           Time: [id number] PT Time Calculation (min) (ACUTE ONLY): 37 min   Charges:   PT Evaluation $PT Eval Low Complexity: 1 Procedure PT Treatments $Gait Training: 8-22 mins $Therapeutic Exercise: 8-22 mins   PT G Codes:        Weston Anna, MPT Pager: [phone]

## 2015-10-25 NOTE — Discharge Instructions (Signed)

## 2015-10-25 NOTE — Progress Notes (Signed)
     Subjective: 1 Day Post-Op Procedure(s) (LRB): LEFT TOTAL HIP ARTHROPLASTY ANTERIOR APPROACH (Left)   Patient reports pain as mild, pain controlled. No events throughout the night. Looking forward to working with PT.  Ready to be discharged home if she does well with PT.  Objective:   VITALS:   Filed Vitals:   10/25/15 0100 10/25/15 0500  BP: 114/51 108/46  Pulse: 63 66  Temp: 97.9 F (36.6 C) 98.4 F (36.9 C)  Resp: 16 16    Dorsiflexion/Plantar flexion intact Incision: dressing C/D/I No cellulitis present Compartment soft  LABS  Recent Labs  10/25/15 0414  HGB 11.1*  HCT 33.1*  WBC 7.7  PLT 195     Recent Labs  10/25/15 0414  NA 138  K 4.0  BUN 12  CREATININE 0.80  GLUCOSE 151*     Assessment/Plan: 1 Day Post-Op Procedure(s) (LRB): LEFT TOTAL HIP ARTHROPLASTY ANTERIOR APPROACH (Left) Foley cath d/c'ed Advance diet Up with therapy D/C IV fluids Discharge home with home health  Follow up in 2 weeks at [facility]. Follow up with OLIN,Windie Marasco D in 2 weeks.  Contact information:  [facility] [address], [place] [id number]    Obese (BMI 30-39.9) Estimated body mass index is 38.68 kg/(m^2) as calculated from the following:   Height as of this encounter: 5\' 7"  (1.702 m).   Weight as of this encounter: 112.038 kg (247 lb). Patient also counseled that weight may inhibit the healing process Patient counseled that losing weight will help with future health issues        West Pugh. Kayston Jodoin   PAC  10/25/2015, 9:11 AM

## 2015-10-30 NOTE — Discharge Summary (Signed)
Physician Discharge Summary  Patient ID: Michele Chavez MRN: [id number] DOB/AGE: August 31, 1950 65 y.o.  Admit date: 10/24/2015 Discharge date: 10/25/2015   Procedures:  Procedure(s) (LRB): LEFT TOTAL HIP ARTHROPLASTY ANTERIOR APPROACH (Left)  Attending Physician:  Dr. Paralee Cancel   Admission Diagnoses:   Left hip primary OA / pain  Discharge Diagnoses:  Principal Problem:   S/P left THA, AA Active Problems:   Obese  Past Medical History  Diagnosis Date  . Hypertension   . Hyperlipidemia   . Arthritis   . History of skin cancer   . Cancer (HCC)     HX SKIN CANCER  . Allergy     In ears, itches on occassions    HPI:    Michele Chavez, 65 y.o. female, has a history of pain and functional disability in the left hip(s) due to arthritis and patient has failed non-surgical conservative treatments for greater than 12 weeks to include NSAID's and/or analgesics, use of assistive devices and activity modification. Onset of symptoms was gradual starting ~1 years ago with gradually worsening course since that time.The patient noted prior procedures of the hip to include arthroplasty on the right hip in [place], [place]. Patient currently rates pain in the left hip at 9 out of 10 with activity. Patient has night pain, worsening of pain with activity and weight bearing, trendelenberg gait, pain that interfers with activities of daily living and pain with passive range of motion. Patient has evidence of periarticular osteophytes and joint space narrowing by imaging studies. This condition presents safety issues increasing the risk of falls. There is no current active infection. Risks, benefits and expectations were discussed with the patient. Risks including but not limited to the risk of anesthesia, blood clots, nerve damage, blood vessel damage, failure of the prosthesis, infection and up to and including death. Patient understand the risks, benefits and expectations and wishes to proceed with  surgery.   PCP: Lajuana Matte, PA-C   Discharged Condition: good  Hospital Course:  Patient underwent the above stated procedure on 10/24/2015. Patient tolerated the procedure well and brought to the recovery room in good condition and subsequently to the floor.  POD #1 BP: 108/46 ; Pulse: 66 ; Temp: 98.4 F (36.9 C) ; Resp: 16 Patient reports pain as mild, pain controlled. No events throughout the night. Looking forward to working with PT. Ready to be discharged home. Dorsiflexion/plantar flexion intact, incision: dressing C/D/I, no cellulitis present and compartment soft.   LABS  Basename    HGB     11.1  HCT     33.1    Discharge Exam: General appearance: alert, cooperative and no distress Extremities: Homans sign is negative, no sign of DVT, no edema, redness or tenderness in the calves or thighs and no ulcers, gangrene or trophic changes  Disposition: Home with follow up in 2 weeks   Follow-up Information    Follow up with Mauri Pole, MD. Schedule an appointment as soon as possible for a visit in 2 weeks.   Specialty:  Orthopedic Surgery   Contact information:   [address] [place] [address] [phone]       Follow up with [facility].   Why:  rolling walker   Contact information:   [address] [place] [address] [phone]       Discharge Instructions    Call MD / Call 911    Complete by:  As directed   If you  experience chest pain or shortness of breath, CALL 911 and be transported to the hospital emergency room.  If you develope a fever above 101 F, pus (white drainage) or increased drainage or redness at the wound, or calf pain, call your surgeon's office.     Change dressing    Complete by:  As directed   Maintain surgical dressing until follow up in the clinic. If the edges start to pull up, may reinforce with tape. If the dressing is no longer working, may remove and cover with gauze and tape, but  must keep the area dry and clean.  Call with any questions or concerns.     Constipation Prevention    Complete by:  As directed   Drink plenty of fluids.  Prune juice may be helpful.  You may use a stool softener, such as Colace (over the counter) 100 mg twice a day.  Use MiraLax (over the counter) for constipation as needed.     Diet - low sodium heart healthy    Complete by:  As directed      Discharge instructions    Complete by:  As directed   Maintain surgical dressing until follow up in the clinic. If the edges start to pull up, may reinforce with tape. If the dressing is no longer working, may remove and cover with gauze and tape, but must keep the area dry and clean.  Follow up in 2 weeks at [facility]. Call with any questions or concerns.     Increase activity slowly as tolerated    Complete by:  As directed   Weight bearing as tolerated with assist device (walker, cane, etc) as directed, use it as long as suggested by your surgeon or therapist, typically at least 4-6 weeks.     TED hose    Complete by:  As directed   Use stockings (TED hose) for 2 weeks on both leg(s).  You may remove them at night for sleeping.             Medication List    STOP taking these medications        ALEVE 220 MG tablet  Generic drug:  naproxen sodium      TAKE these medications        aspirin 325 MG EC tablet  Take 1 tablet (325 mg total) by mouth 2 (two) times daily.     Coenzyme Q10 100 MG Tabs  Take 100 mg by mouth daily.     docusate sodium 100 MG capsule  Commonly known as:  COLACE  Take 1 capsule (100 mg total) by mouth 2 (two) times daily.     ferrous sulfate 325 (65 FE) MG tablet  Take 1 tablet (325 mg total) by mouth 3 (three) times daily after meals.     HYDROcodone-acetaminophen 7.5-325 MG tablet  Commonly known as:  NORCO  Take 1-2 tablets by mouth every 4 (four) hours as needed for moderate pain.     Hydrocortisone Butyrate 0.1 % Soln  Place 2-3 drops  into both ears daily as needed (She uses in ears for itching.).     olmesartan-hydrochlorothiazide 40-25 MG tablet  Commonly known as:  BENICAR HCT  Take 1 tablet by mouth daily.     OMEGA 3 PO  Take 1 tablet by mouth daily.     polyethylene glycol packet  Commonly known as:  MIRALAX / GLYCOLAX  Take 17 g by mouth 2 (two) times daily.  pravastatin 20 MG tablet  Commonly known as:  PRAVACHOL  Take 20 mg by mouth at bedtime.     PRED FORTE 1 % ophthalmic suspension  Generic drug:  prednisoLONE acetate  Place 3 drops into both ears daily as needed (She uses in ears for itching.).     tiZANidine 4 MG tablet  Commonly known as:  ZANAFLEX  Take 1 tablet (4 mg total) by mouth every 6 (six) hours as needed for muscle spasms.     Vitamin D3 2000 units Tabs  Take 2,000 Units by mouth daily.         Signed: [place] P[place]. Rodnisha Blomgren   PA-C  10/30/2015, 2:51 PM

## 2017-11-19 IMAGING — DX DG HIP (WITH OR WITHOUT PELVIS) 1V PORT*L*
2 series · 2 of 2 positions shown · non-contrast
Comparison: None.

CLINICAL DATA: Status post left hip replacement

EXAM:
DG HIP (WITH OR WITHOUT PELVIS) 1V PORT LEFT

[pelvis ap]
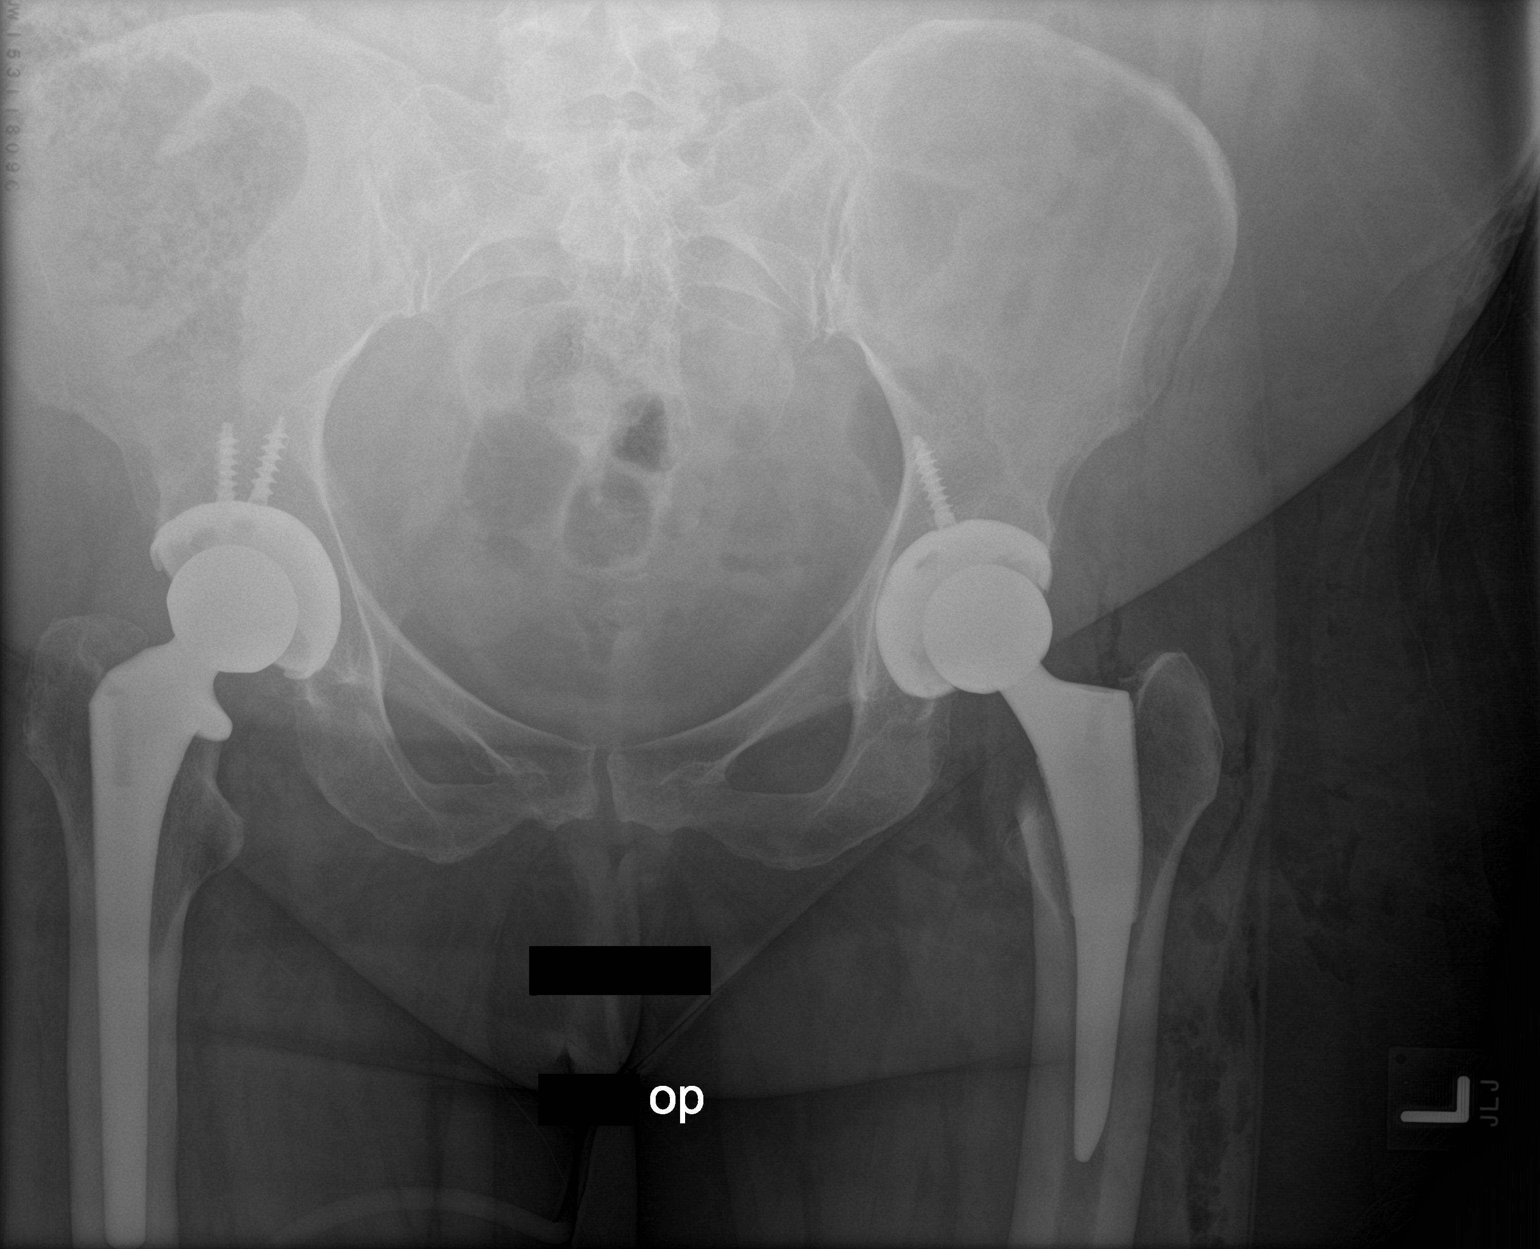

[hip frog leg]
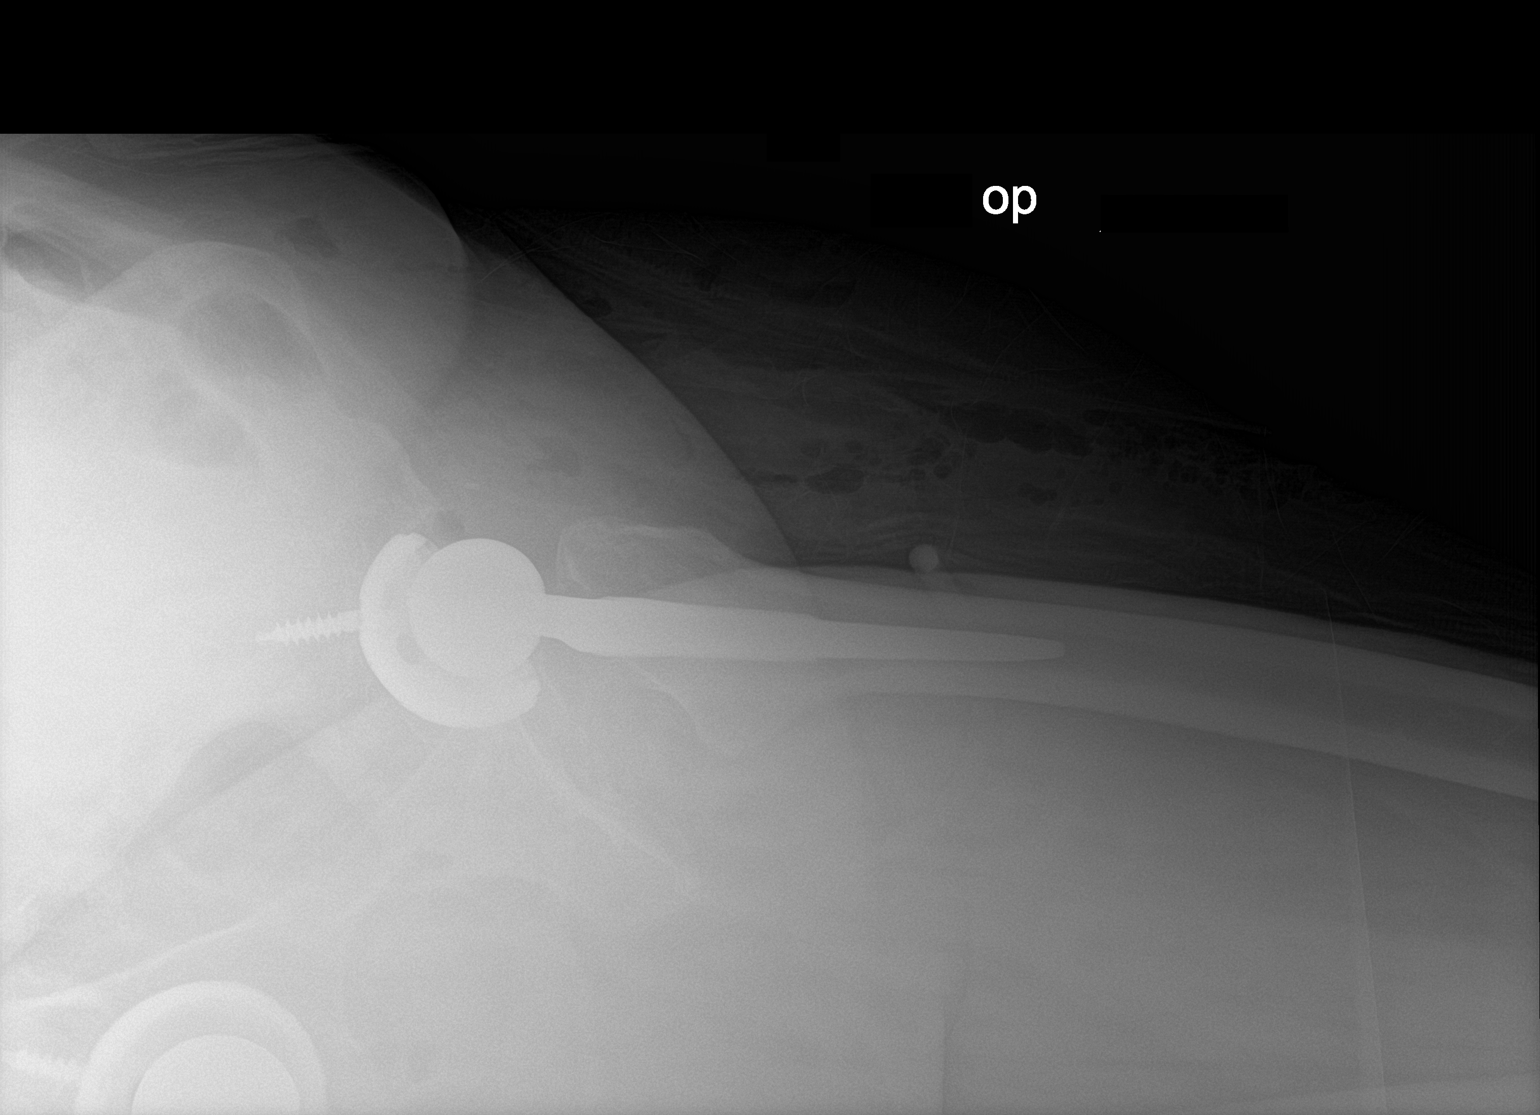

[2 of 2 positions shown; findings below may reference images not displayed]

FINDINGS: Interval total left hip arthroplasty without failure or
complication. Postsurgical changes in the soft tissues surrounding
the left hip. No acute fracture or dislocation.

Prior right hip arthroplasty without failure or complication.
IMPRESSION: Interval left hip arthroplasty.

## 2022-07-21 LAB — EXTERNAL GENERIC LAB PROCEDURE: COLOGUARD: NEGATIVE

## 2023-04-04 ENCOUNTER — Encounter (HOSPITAL_COMMUNITY): Payer: Self-pay

## 2023-04-04 ENCOUNTER — Emergency Department (HOSPITAL_COMMUNITY): Payer: Medicare Other

## 2023-04-04 ENCOUNTER — Emergency Department (HOSPITAL_COMMUNITY)
Admission: EM | Admit: 2023-04-04 | Discharge: 2023-04-05 | Disposition: A | Discharge status: Home / Self Care | Payer: Medicare Other | Attending: Emergency Medicine | Admitting: Emergency Medicine

## 2023-04-04 ENCOUNTER — Other Ambulatory Visit: Payer: Self-pay

## 2023-04-04 DIAGNOSIS — S060XAA Concussion with loss of consciousness status unknown, initial encounter: Secondary | ICD-10-CM | POA: Diagnosis not present

## 2023-04-04 DIAGNOSIS — W01198A Fall on same level from slipping, tripping and stumbling with subsequent striking against other object, initial encounter: Secondary | ICD-10-CM | POA: Insufficient documentation

## 2023-04-04 DIAGNOSIS — S0990XA Unspecified injury of head, initial encounter: Secondary | ICD-10-CM | POA: Diagnosis present

## 2023-04-04 DIAGNOSIS — Z7901 Long term (current) use of anticoagulants: Secondary | ICD-10-CM | POA: Diagnosis not present

## 2023-04-04 DIAGNOSIS — I10 Essential (primary) hypertension: Secondary | ICD-10-CM | POA: Insufficient documentation

## 2023-04-04 DIAGNOSIS — Z79899 Other long term (current) drug therapy: Secondary | ICD-10-CM | POA: Diagnosis not present

## 2023-04-04 DIAGNOSIS — Z85828 Personal history of other malignant neoplasm of skin: Secondary | ICD-10-CM | POA: Diagnosis not present

## 2023-04-04 DIAGNOSIS — I4891 Unspecified atrial fibrillation: Secondary | ICD-10-CM | POA: Diagnosis not present

## 2023-04-04 DIAGNOSIS — W19XXXA Unspecified fall, initial encounter: Secondary | ICD-10-CM

## 2023-04-04 LAB — CBC WITH DIFFERENTIAL/PLATELET
Abs Immature Granulocytes: 0.03 10*3/uL (ref 0.00–0.07)
Basophils Absolute: 0 10*3/uL (ref 0.0–0.1)
Basophils Relative: 1 %
Eosinophils Absolute: 0 10*3/uL (ref 0.0–0.5)
Eosinophils Relative: 1 %
HCT: 43.4 % (ref 36.0–46.0)
Hemoglobin: 14 g/dL (ref 12.0–15.0)
Immature Granulocytes: 1 %
Lymphocytes Relative: 27 %
Lymphs Abs: 1.6 10*3/uL (ref 0.7–4.0)
MCH: 30.2 pg (ref 26.0–34.0)
MCHC: 32.3 g/dL (ref 30.0–36.0)
MCV: 93.5 fL (ref 80.0–100.0)
Monocytes Absolute: 0.4 10*3/uL (ref 0.1–1.0)
Monocytes Relative: 7 %
Neutro Abs: 3.9 10*3/uL (ref 1.7–7.7)
Neutrophils Relative %: 63 %
Platelets: 195 10*3/uL (ref 150–400)
RBC: 4.64 MIL/uL (ref 3.87–5.11)
RDW: 15 % (ref 11.5–15.5)
WBC: 6 10*3/uL (ref 4.0–10.5)
nRBC: 0 % (ref 0.0–0.2)

## 2023-04-04 LAB — BASIC METABOLIC PANEL
Anion gap: 16 — ABNORMAL HIGH (ref 5–15)
BUN: 18 mg/dL (ref 8–23)
CO2: 21 mmol/L — ABNORMAL LOW (ref 22–32)
Calcium: 9.5 mg/dL (ref 8.9–10.3)
Chloride: 103 mmol/L (ref 98–111)
Creatinine, Ser: 0.96 mg/dL (ref 0.44–1.00)
GFR, Estimated: >60 mL/min (ref >60)
Glucose, Bld: 114 mg/dL — ABNORMAL HIGH (ref 70–99)
Potassium: 4 mmol/L (ref 3.5–5.1)
Sodium: 140 mmol/L (ref 135–145)

## 2023-04-04 MED ORDER — ONDANSETRON HCL 4 MG/2ML IJ SOLN
4.0000 mg | Freq: Once | INTRAMUSCULAR | Status: AC
  Administered 2023-04-04: 4 mg via INTRAVENOUS
  Filled 2023-04-04: qty 2

## 2023-04-04 MED ORDER — FENTANYL CITRATE PF 50 MCG/ML IJ SOSY
12.5000 ug | PREFILLED_SYRINGE | Freq: Once | INTRAMUSCULAR | Status: AC
  Administered 2023-04-04: 12.5 ug via INTRAVENOUS
  Filled 2023-04-04: qty 1

## 2023-04-04 MED ORDER — ONDANSETRON 4 MG PO TBDP: 4.0000 mg | ORAL_TABLET | Freq: Three times a day (TID) | ORAL | 0 refills | Status: AC | PRN

## 2023-04-04 NOTE — ED Provider Notes (Signed)
Accepted handoff at shift change from [facility]. Please see prior provider note for more detail.   Briefly: Patient is 72 y.o. who presents for fall.  Is on a blood thinner.  DDX: concern for intracranial bleed, multiple trauma/fractures, concussion   Plan: Follow-up on CT scans.  Reassess.  If scans are unremarkable and patient feels well can be discharged with PCP follow-up.    Physical Exam  BP (!) 153/53   Pulse 77   Temp (!) 97.4 F (36.3 C) (Oral)   Resp 16   Ht 5\' 7"  (1.702 m)   Wt 112 kg   SpO2 93%   BMI 38.67 kg/m   Physical Exam  Procedures  Procedures  ED Course / MDM   Clinical Course as of 04/04/23 2345  Fri Apr 04, 2023  2335 Fall on thinners. CT scans pending. Can go if scans are ok.  [JR]    Clinical Course User Index [JR] Gareth Eagle, PA-C   Medical Decision Making Amount and/or Complexity of Data Reviewed Labs: ordered. Radiology: ordered.  Risk Prescription drug management.   On reassessment, patient states she felt better.  No acute findings on CT of the head and cervical spine.  Vital stable.  Discussed return precautions.  Discharge.       Gareth Eagle, PA-C 04/05/23 0000    Tilden Fossa, MD 04/05/23 [id number]

## 2023-04-04 NOTE — Progress Notes (Signed)
   04/04/23 2152  Spiritual Encounters  Type of Visit Initial  Care provided to: Patient;Friend  Conversation partners present during encounter Nurse  Referral source Trauma page  Reason for visit Trauma  OnCall Visit Yes   Chaplain responded to Trauma 2, Fall on Thinners.  Pt was very anxious and shaking.  Chaplain asked about a support person, Pt informed that her friend was in the waiting room.  Chaplain asked medical team if it would be ok to bring friend back as it might help Pt feel less anxious. Chaplain escorted friend to bedside.  Chaplain services remain available by Spiritual Consult or for emergent cases, paging [phone]  Chaplain Raelene Bott, MDiv [email or web address] [phone]

## 2023-04-04 NOTE — Discharge Instructions (Addendum)
Your workup today is reassuring.  No head bleed or other concerning findings.  Take Tylenol as you need to for the headache.  Follow-up with your primary care provider.  For any concerning symptoms return to the emergency room.  You likely may have a concussion.  Symptoms with the concussion are headache, sensitivity to light, nausea and occasional vomiting.  If these symptoms become severe such as severe headache, vision change, vomiting to the point you are unable to keep any food or drink down return to the emergency room.  Symptoms should last no longer than a week.  If they last longer than a week follow-up with your primary care doctor.

## 2023-04-04 NOTE — ED Notes (Signed)
ED Provider at bedside. 

## 2023-04-04 NOTE — ED Triage Notes (Signed)
DB is a 72 yo who was sitting on a bar stool with her friend when she fell. She does not remember falling or why she fell. She woke up leaned against the counter. She hit the back of her head. She is on Eliquis. Denies neck pain., has a strong headache and is very nauseous. She is also anxious because she hates hospitals. She is alert and oriented x4 but is a little lethargic at times.

## 2023-04-04 NOTE — ED Provider Notes (Signed)
[place] EMERGENCY DEPARTMENT AT [facility] Provider Note   CSN: [id number] Arrival date & time: 04/04/23  2106     History  Chief Complaint  Patient presents with   Fall   Trauma    Michele Chavez is a 72 y.o. female.  72 year old female presents today as a level 2 trauma for fall on thinners.  She was sitting at a barstool with her friend when she fell backwards striking her head on the counter.  She is on Eliquis and reports compliance with this.  Complains of headache currently and nausea.  Denies other complaints.  No joint pain.  She does not recall the events prior to the fall.  She states she remembers waking up with plan by her side.  Denies chest pain, palpitations, shortness of breath.  The history is provided by the patient. No language interpreter was used.       Home Medications Prior to Admission medications   Medication Sig Start Date End Date Taking? Authorizing Provider  Cholecalciferol (VITAMIN D3) 2000 units TABS Take 2,000 Units by mouth daily.    [provider]  Coenzyme Q10 100 MG TABS Take 100 mg by mouth daily.    [provider]  docusate sodium (COLACE) 100 MG capsule Take 1 capsule (100 mg total) by mouth 2 (two) times daily. 10/25/15   Lanney Gins, PA-C  ferrous sulfate 325 (65 FE) MG tablet Take 1 tablet (325 mg total) by mouth 3 (three) times daily after meals. 10/25/15   Lanney Gins, PA-C  HYDROcodone-acetaminophen (NORCO) 7.5-325 MG tablet Take 1-2 tablets by mouth every 4 (four) hours as needed for moderate pain. 10/25/15   Lanney Gins, PA-C  Hydrocortisone Butyrate 0.1 % SOLN Place 2-3 drops into both ears daily as needed (She uses in ears for itching.).    [provider]  olmesartan-hydrochlorothiazide (BENICAR HCT) 40-25 MG tablet Take 1 tablet by mouth daily.    [provider]  Omega-3 Fatty Acids (OMEGA 3 PO) Take 1 tablet by mouth daily.    [provider]  polyethylene  glycol (MIRALAX / GLYCOLAX) packet Take 17 g by mouth 2 (two) times daily. 10/25/15   Lanney Gins, PA-C  pravastatin (PRAVACHOL) 20 MG tablet Take 20 mg by mouth at bedtime.    [provider]  prednisoLONE acetate (PRED FORTE) 1 % ophthalmic suspension Place 3 drops into both ears daily as needed (She uses in ears for itching.).    [provider]  tiZANidine (ZANAFLEX) 4 MG tablet Take 1 tablet (4 mg total) by mouth every 6 (six) hours as needed for muscle spasms. 10/25/15   Lanney Gins, PA-C      Allergies    Patient has no known allergies.    Review of Systems   Review of Systems  Constitutional:  Negative for chills and fever.  Respiratory:  Negative for shortness of breath.   Cardiovascular:  Negative for chest pain.  Gastrointestinal:  Positive for nausea.  Neurological:  Positive for syncope and headaches.  All other systems reviewed and are negative.   Physical Exam Updated Vital Signs BP (!) 154/70   Pulse 72   Temp (!) 97.4 F (36.3 C) (Oral)   Resp 16   Ht 5\' 7"  (1.702 m)   Wt 112 kg   SpO2 98%   BMI 38.67 kg/m  Physical Exam Vitals and nursing note reviewed.  Constitutional:      General: She is not in acute distress.  Appearance: Normal appearance. She is not ill-appearing.  HENT:     Head: Normocephalic and atraumatic.     Nose: Nose normal.  Eyes:     Conjunctiva/sclera: Conjunctivae normal.  Cardiovascular:     Rate and Rhythm: Normal rate. Rhythm irregular.  Pulmonary:     Effort: Pulmonary effort is normal. No respiratory distress.  Musculoskeletal:        General: No deformity. Normal range of motion.     Cervical back: Normal range of motion.  Skin:    Findings: No rash.  Neurological:     Mental Status: She is alert.     ED Results / Procedures / Treatments   Labs (all labs ordered are listed, but only abnormal results are displayed) Labs Reviewed  CBC WITH DIFFERENTIAL/PLATELET  BASIC METABOLIC PANEL     EKG None  Radiology No results found.  Procedures .Critical Care  Performed by: Marita Kansas, PA-C Authorized by: Marita Kansas, PA-C   Critical care provider statement:    Critical care time (minutes):  30   Critical care was necessary to treat or prevent imminent or life-threatening deterioration of the following conditions:  Trauma   Critical care was time spent personally by me on the following activities:  Development of treatment plan with patient or surrogate, discussions with consultants, evaluation of patient's response to treatment, examination of patient, ordering and review of laboratory studies, ordering and review of radiographic studies, ordering and performing treatments and interventions, pulse oximetry, re-evaluation of patient's condition and review of old charts     Medications Ordered in ED Medications  fentaNYL (SUBLIMAZE) injection 12.5 mcg (12.5 mcg Intravenous Given 04/04/23 2129)  ondansetron (ZOFRAN) injection 4 mg (4 mg Intravenous Given 04/04/23 2130)    ED Course/ Medical Decision Making/ A&P Clinical Course as of 04/04/23 2337  Fri Apr 04, 2023  2335 Fall on thinners. CT scans pending. Can go if scans are ok.  [JR]    Clinical Course User Index [JR] Gareth Eagle, PA-C                                 Medical Decision Making Amount and/or Complexity of Data Reviewed Labs: ordered. Radiology: ordered.  Risk Prescription drug management.   Medical Decision Making / ED Course   This patient presents to the ED for concern of fall on thinners, this involves an extensive number of treatment options, and is a complaint that carries with it a high risk of complications and morbidity.  The differential diagnosis includes intracranial bleed, multiple trauma/fractures, concussion  MDM: 72 year old female presents today for concern of fall on thinners as a level 2 trauma.  No open laceration or wounds.  Denies any other joint pain.  Complaining  of headache and nausea.  Will obtain CT head and cervical spine.  Will get CBC and BMP.  EKG obtained.  No acute ischemic changes or concerning arrhythmia.  Remained on telemetry without any concerning arrhythmia.  EKG shows A-fib that is rate controlled otherwise no acute concerns.  CT imaging pending.  CBC unremarkable.  BMP shows glucose of 114 otherwise no acute concerns.  CT imaging pending at the time of discharge.  Signed out to oncoming provider.  Lab Tests: -I ordered, reviewed, and interpreted labs.   The pertinent results include:   Labs Reviewed  BASIC METABOLIC PANEL - Abnormal; Notable for the following components:      Result Value  CO2 21 (*)    Glucose, Bld 114 (*)    Anion gap 16 (*)    All other components within normal limits  CBC WITH DIFFERENTIAL/PLATELET      EKG  EKG Interpretation Date/Time:  Friday April 04 2023 21:13:08 EDT Ventricular Rate:  77 PR Interval:    QRS Duration:  90 QT Interval:  408 QTC Calculation: 462 R Axis:   10  Text Interpretation: Atrial fibrillation Anteroseptal infarct, age indeterminate Confirmed by Alvino Blood ([id number]) on 04/04/2023 10:46:33 PM         Imaging Studies ordered: I ordered imaging studies including CT head, CT cervical spine I independently visualized and interpreted imaging. I agree with the radiologist interpretation   Medicines ordered and prescription drug management: Meds ordered this encounter  Medications   fentaNYL (SUBLIMAZE) injection 12.5 mcg   ondansetron (ZOFRAN) injection 4 mg   ondansetron (ZOFRAN-ODT) 4 MG disintegrating tablet    Sig: Take 1 tablet (4 mg total) by mouth every 8 (eight) hours as needed.    Dispense:  20 tablet    Refill:  0    Order Specific Question:   Supervising Provider    Answer:   MILLER, BRIAN [[id number]]    -I have reviewed the patients home medicines and have made adjustments as needed   Cardiac Monitoring: The patient was maintained on a cardiac  monitor.  I personally viewed and interpreted the cardiac monitored which showed an underlying rhythm of: Sinus rhythm  Reevaluation: After the interventions noted above, I reevaluated the patient and found that they have :improved  Co morbidities that complicate the patient evaluation  Past Medical History:  Diagnosis Date   Allergy    In ears, itches on occassions   Arthritis    Cancer (HCC)    HX SKIN CANCER   History of skin cancer    Hyperlipidemia    Hypertension       Dispostion: Signout to oncoming provider pending CT scans.    Final Clinical Impression(s) / ED Diagnoses Final diagnoses:  Fall, initial encounter  Concussion with unknown loss of consciousness status, initial encounter    Rx / DC Orders ED Discharge Orders          Ordered    ondansetron (ZOFRAN-ODT) 4 MG disintegrating tablet  Every 8 hours PRN        04/04/23 2328              Marita Kansas, PA-C 04/04/23 2337    Lonell Grandchild, MD 04/05/23 1252

## 2023-04-04 NOTE — Progress Notes (Signed)
Orthopedic Tech Progress Note Patient Details:  Michele Chavez [birth year] [id number]  Level 2 trauma   Patient ID: Johnsie Cancel, female   DOB: [birth year], 72 y.o.   MRN: [id number]  Donald Pore 04/04/2023, 9:51 PM

## 2023-04-05 NOTE — ED Notes (Signed)
Pt inquiring about being prescribed something stronger for pain besides tylenol. Provider made aware.

## 2023-04-05 NOTE — ED Notes (Signed)
Report received from [facility]. S Paramedic. Assumed care of pt at this time.
